# Patient Record
Sex: Male | Born: 1990 | Race: White | Hispanic: No | Marital: Married | Smoking: Never smoker
Health system: Southern US, Community
[De-identification: ages and names within clinical notes are randomized; demographics above are authoritative.]

## PROBLEM LIST (undated history)

## (undated) DIAGNOSIS — F988 Other specified behavioral and emotional disorders with onset usually occurring in childhood and adolescence: Secondary | ICD-10-CM

---

## 2009-08-22 ENCOUNTER — Emergency Department (HOSPITAL_BASED_OUTPATIENT_CLINIC_OR_DEPARTMENT_OTHER): Admission: EM | Admit: 2009-08-22 | Discharge: 2009-08-22 | Payer: Self-pay | Admitting: Emergency Medicine

## 2009-08-22 ENCOUNTER — Ambulatory Visit: Payer: Self-pay | Admitting: Diagnostic Radiology

## 2010-09-03 IMAGING — US US ART/VEN ABD/PELV/SCROTUM DOPPLER COMPLETE
1 series · 14 of 25 positions shown · non-contrast
Comparison: None.

CLINICAL DATA: Right testicular pain for 2 hours.

SCROTAL ULTRASOUND
DOPPLER ULTRASOUND OF THE TESTICLES
TECHNIQUE: Complete ultrasound examination of the testicles,
epididymis, and other scrotal structures was performed.  Color and
spectral Doppler ultrasound were also utilized to evaluate blood
flow to the testicles.

[Series 1: us art/ven abd/pelv/scrotum doppler complete · 0.08mm/px · 14 of 52 slices shown]
[im 1/52]
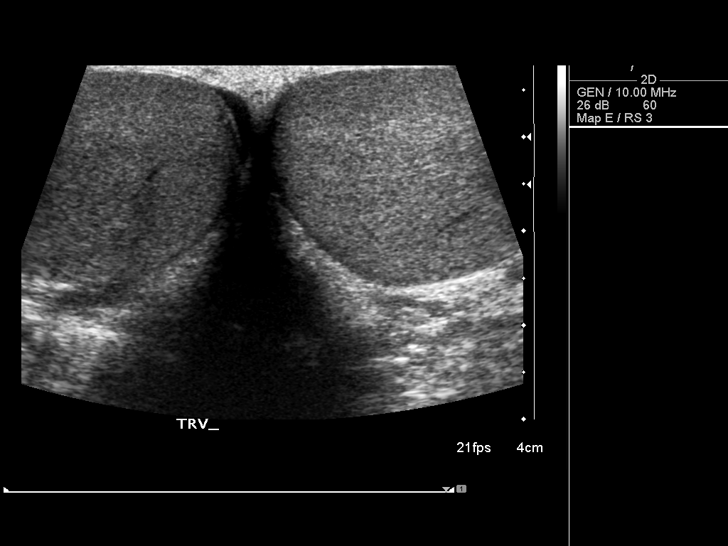
[im 5/52]
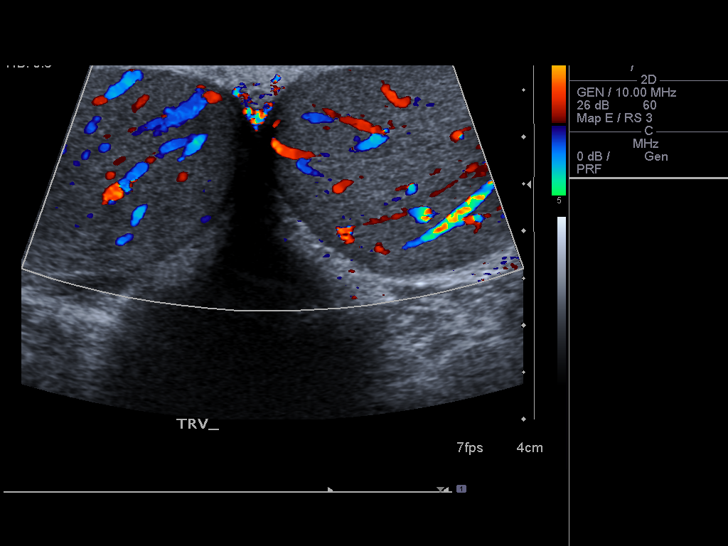
[im 9/52]
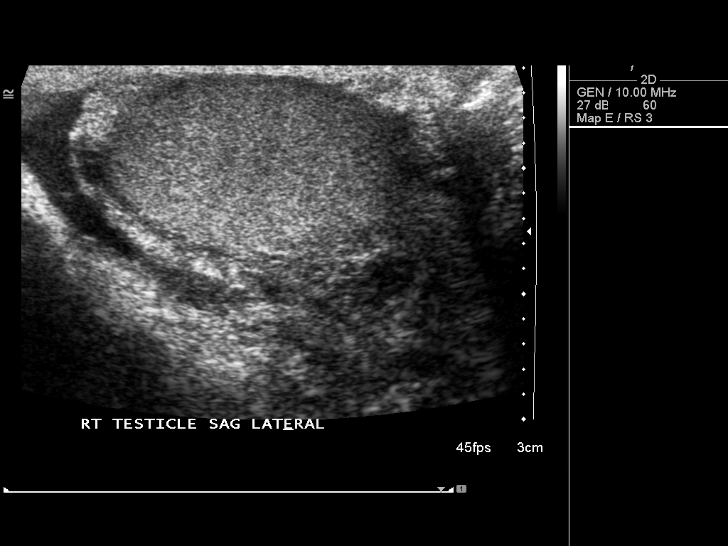
[im 13/52]
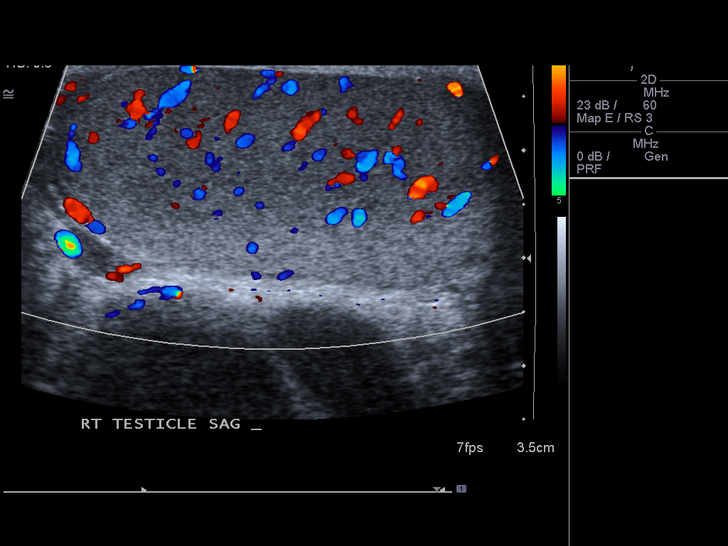
[im 18/52]
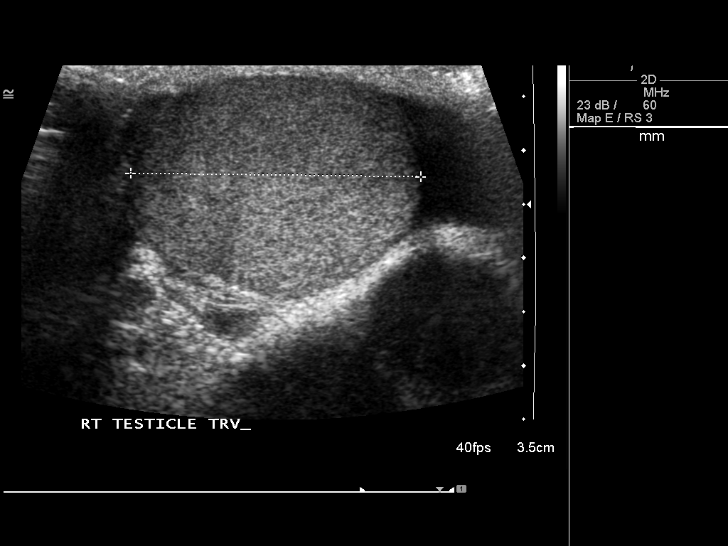
[im 20/52]
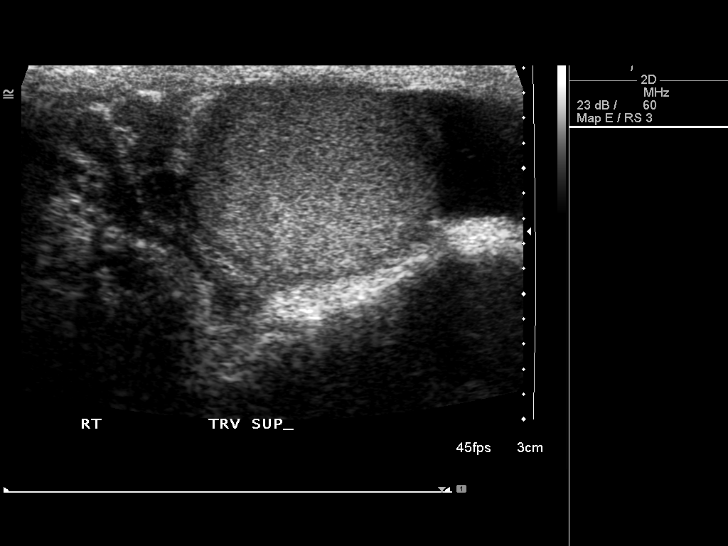
[im 24/52]
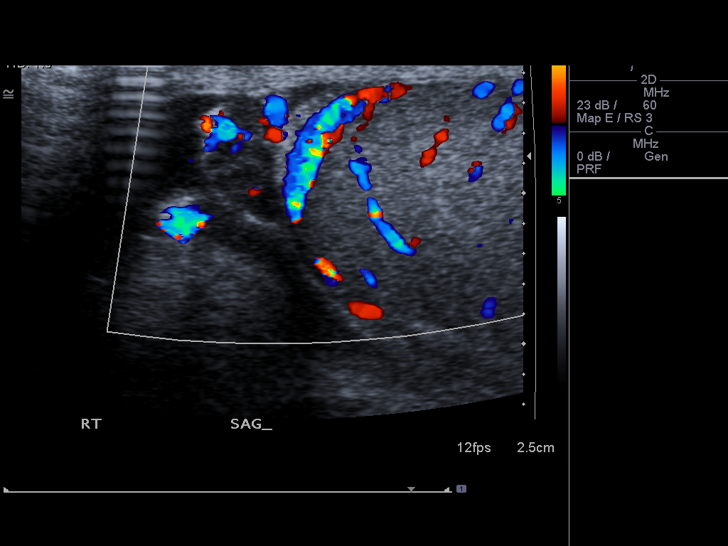
[im 28/52]
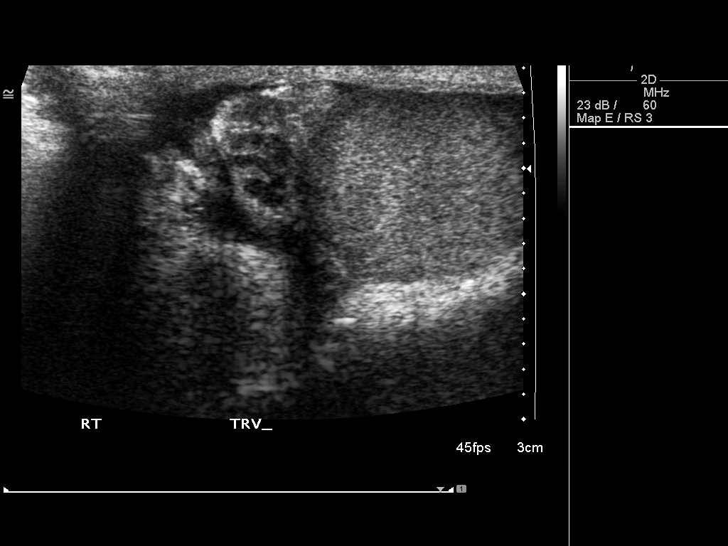
[im 32/52]
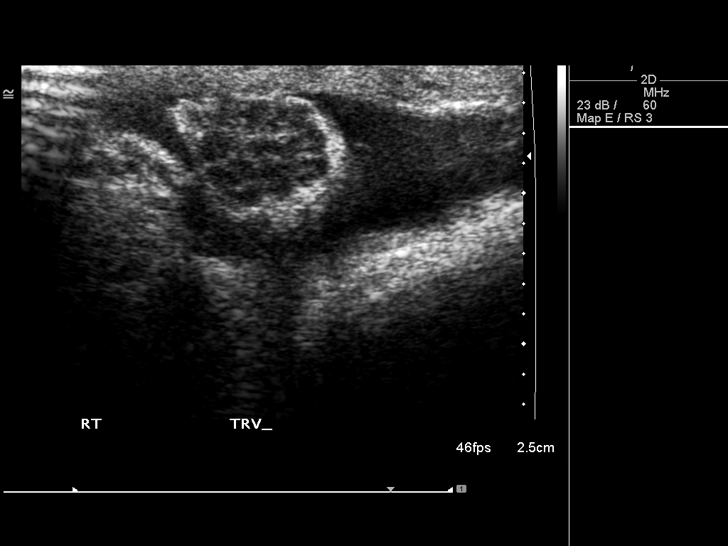
[im 35/52]
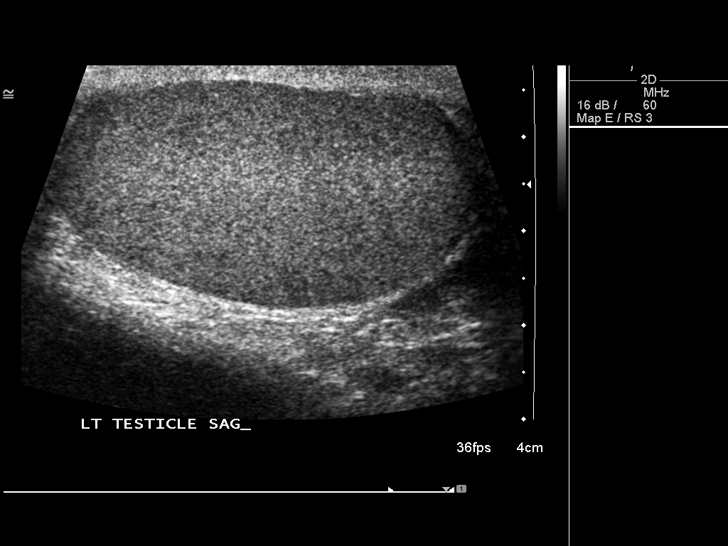
[im 39/52]
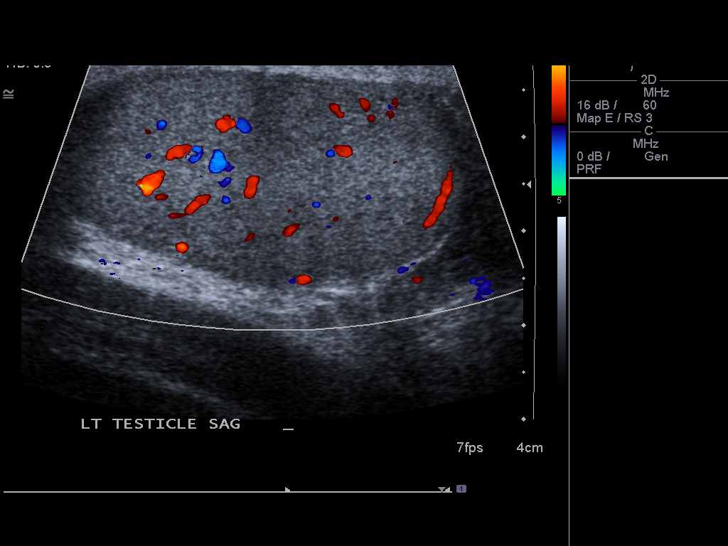
[im 43/52]
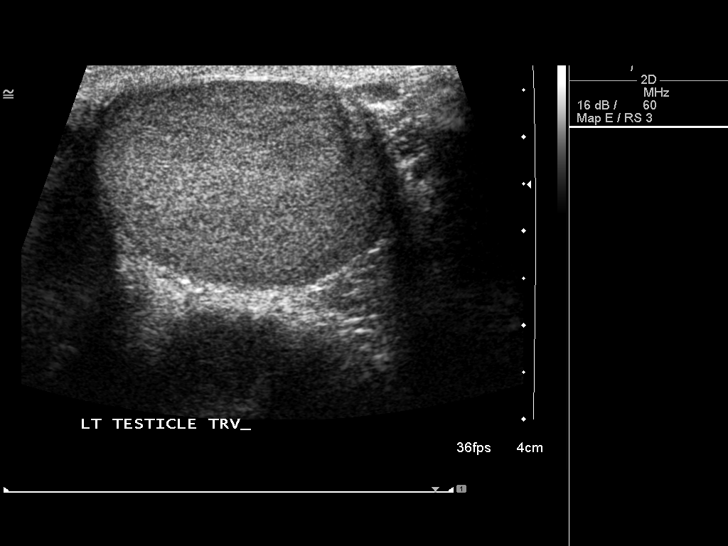
[im 47/52]
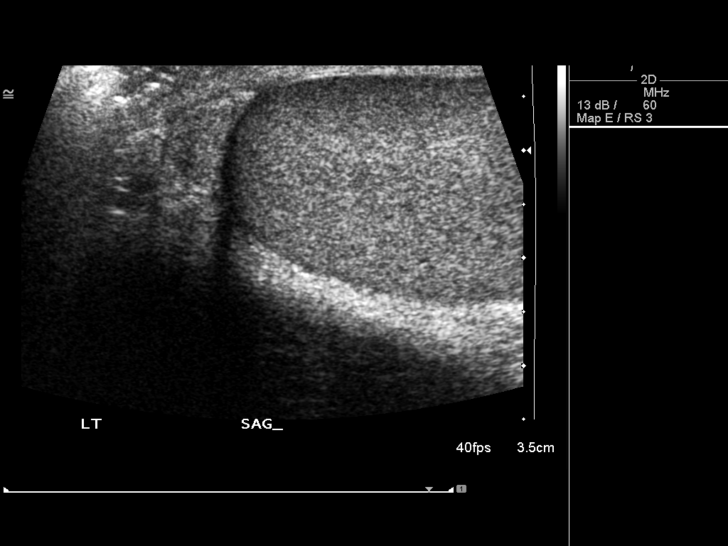
[im 52/52]
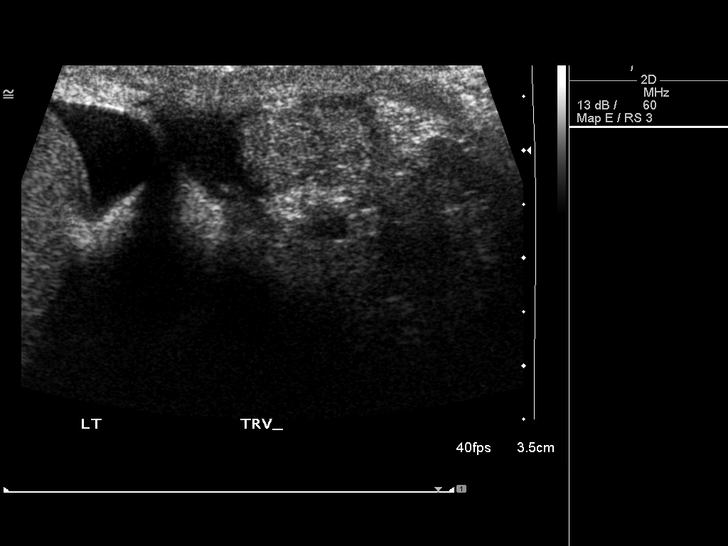

[14 of 25 positions shown; findings below may reference images not displayed]

FINDINGS: There is normal perfusion to both testicles.  The right
epididymis is inhomogeneous with multiple hypoechoic areas as well
as a moderate amount of fluid around the right testicle, consistent
with right epididymitis.

The right testicle is 4.5 x 2.1 x 2.7 cm and the left testicle is
4.4 x 2.5 x 3.1 cm.  There are normal arterial and venous waveforms
in both testicles.  No testicular masses.

The left epididymis is normal.  There are no varicoceles.
IMPRESSION: Right epididymitis.  Normal perfusion to both testicles.

## 2010-11-03 LAB — URINALYSIS, ROUTINE W REFLEX MICROSCOPIC
Bilirubin Urine: NEGATIVE
Glucose, UA: NEGATIVE mg/dL
Hgb urine dipstick: NEGATIVE
Nitrite: NEGATIVE
Specific Gravity, Urine: 1.028 (ref 1.005–1.030)
pH: 7.5 (ref 5.0–8.0)

## 2010-11-03 LAB — GC/CHLAMYDIA PROBE AMP, GENITAL: Chlamydia, DNA Probe: NEGATIVE

## 2012-10-28 ENCOUNTER — Emergency Department (HOSPITAL_BASED_OUTPATIENT_CLINIC_OR_DEPARTMENT_OTHER): Payer: Self-pay

## 2012-10-28 ENCOUNTER — Encounter (HOSPITAL_BASED_OUTPATIENT_CLINIC_OR_DEPARTMENT_OTHER): Payer: Self-pay | Admitting: *Deleted

## 2012-10-28 ENCOUNTER — Emergency Department (HOSPITAL_BASED_OUTPATIENT_CLINIC_OR_DEPARTMENT_OTHER)
Admission: EM | Admit: 2012-10-28 | Discharge: 2012-10-28 | Disposition: A | Discharge status: Home / Self Care | Payer: Self-pay | Attending: Emergency Medicine | Admitting: Emergency Medicine

## 2012-10-28 DIAGNOSIS — N453 Epididymo-orchitis: Secondary | ICD-10-CM | POA: Insufficient documentation

## 2012-10-28 DIAGNOSIS — R350 Frequency of micturition: Secondary | ICD-10-CM | POA: Insufficient documentation

## 2012-10-28 DIAGNOSIS — Z79899 Other long term (current) drug therapy: Secondary | ICD-10-CM | POA: Insufficient documentation

## 2012-10-28 DIAGNOSIS — F988 Other specified behavioral and emotional disorders with onset usually occurring in childhood and adolescence: Secondary | ICD-10-CM | POA: Insufficient documentation

## 2012-10-28 HISTORY — DX: Other specified behavioral and emotional disorders with onset usually occurring in childhood and adolescence: F98.8

## 2012-10-28 LAB — URINALYSIS, ROUTINE W REFLEX MICROSCOPIC
Ketones, ur: NEGATIVE mg/dL
Leukocytes, UA: NEGATIVE
Nitrite: NEGATIVE
Protein, ur: NEGATIVE mg/dL
pH: 7.5 (ref 5.0–8.0)

## 2012-10-28 MED ORDER — IBUPROFEN 800 MG PO TABS: 800.0000 mg | ORAL_TABLET | Freq: Three times a day (TID) | ORAL | Status: DC

## 2012-10-28 MED ORDER — HYDROCODONE-ACETAMINOPHEN 5-325 MG PO TABS: 2.0000 | ORAL_TABLET | ORAL | Status: DC | PRN

## 2012-10-28 MED ORDER — CEFTRIAXONE SODIUM 250 MG IJ SOLR
250.0000 mg | Freq: Once | INTRAMUSCULAR | Status: AC
  Administered 2012-10-28: 250 mg via INTRAMUSCULAR
  Filled 2012-10-28: qty 250

## 2012-10-28 MED ORDER — AZITHROMYCIN 250 MG PO TABS
1000.0000 mg | ORAL_TABLET | Freq: Once | ORAL | Status: AC
Start: 2012-10-28 — End: 2012-10-28
  Administered 2012-10-28: 1000 mg via ORAL
  Filled 2012-10-28: qty 4

## 2012-10-28 NOTE — ED Notes (Signed)
Testicle pain x 1 hour. Hx of same 3 years ago and was epididymitis.

## 2012-10-28 NOTE — ED Notes (Signed)
RN spoke with Pt. About eating before asking the EDP but Pt. Is eating at bedside anymore.

## 2012-10-28 NOTE — ED Notes (Signed)
No complaints of rash and no reaction to his injection.
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
## 2013-11-09 IMAGING — US US ART/VEN ABD/PELV/SCROTUM DOPPLER LTD
1 series · 13 of 25 positions shown · non-contrast
Comparison: 08/22/2009

CLINICAL DATA: Right testicular pain for 2 hours, history of
epididymitis

SCROTAL ULTRASOUND
DOPPLER ULTRASOUND OF THE TESTICLES
TECHNIQUE: Complete ultrasound examination of the testicles,
epididymis, and other scrotal structures was performed.  Color and
spectral Doppler ultrasound were also utilized to evaluate blood
flow to the testicles.

[Series 1: us art/ven abd/pelv/scrotum doppler ltd · 0.08mm/px · 13 of 48 slices shown]
[im 1/48]
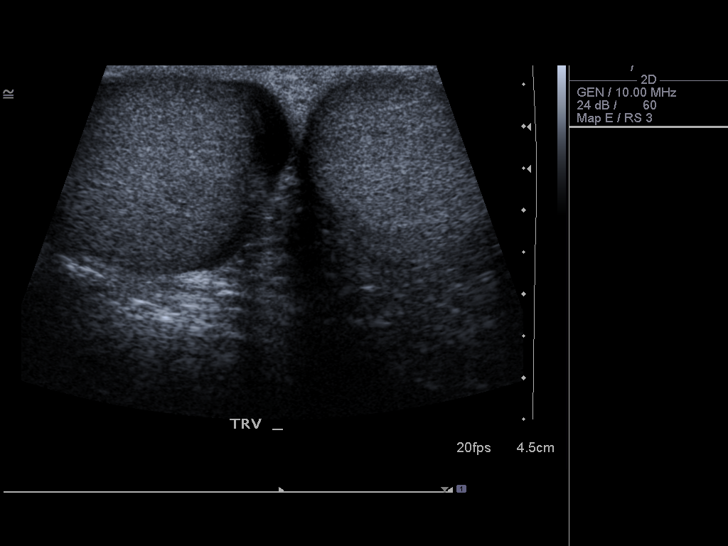
[im 4/48]
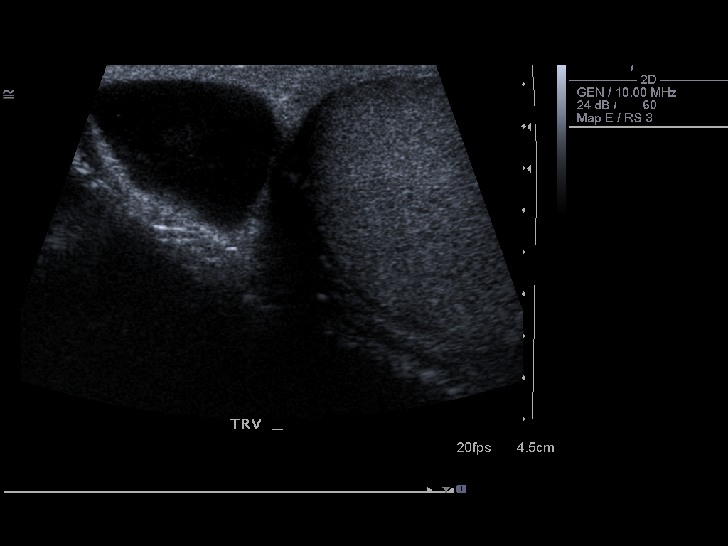
[im 8/48]
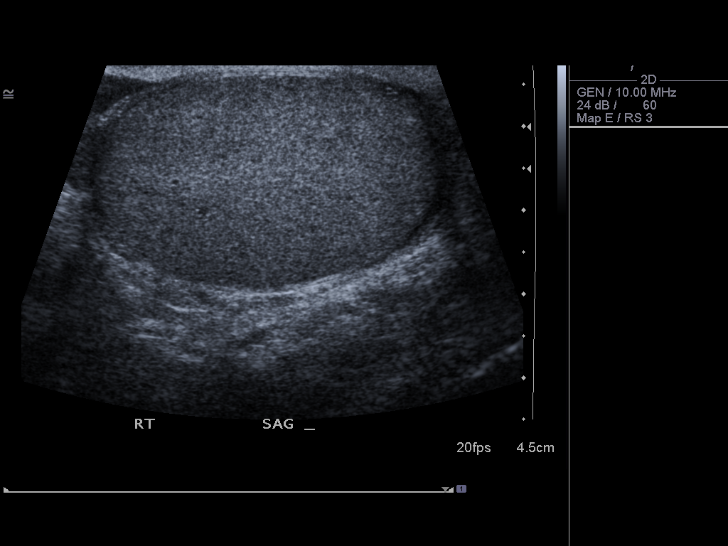
[im 12/48]
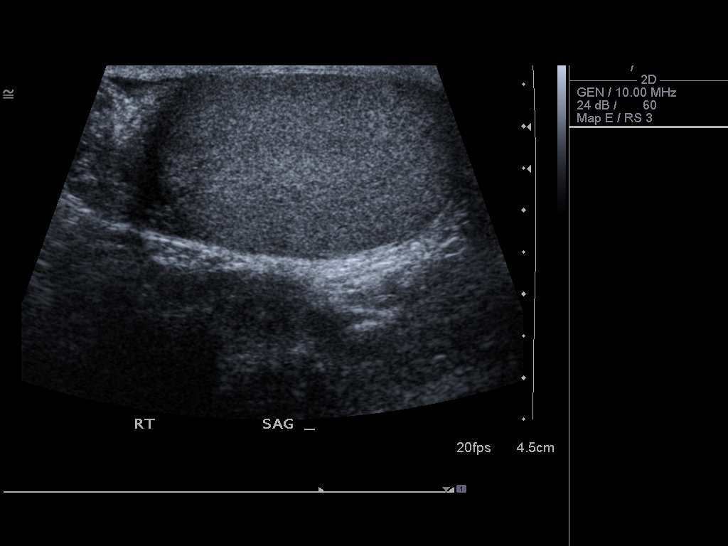
[im 16/48]
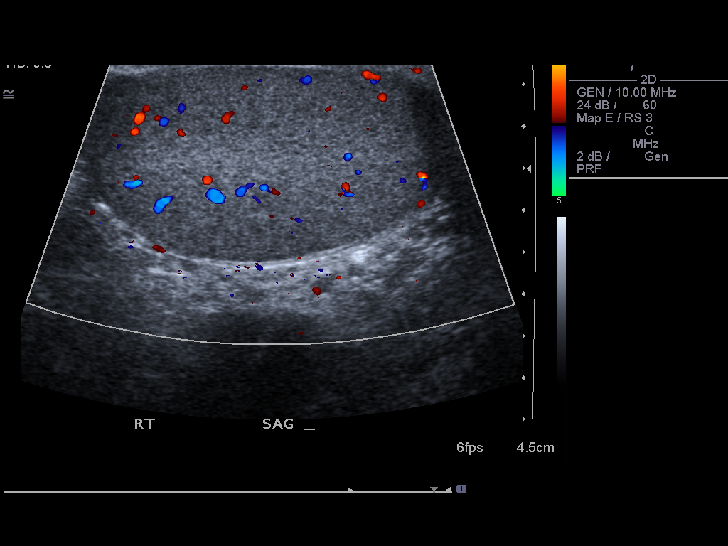
[im 20/48]
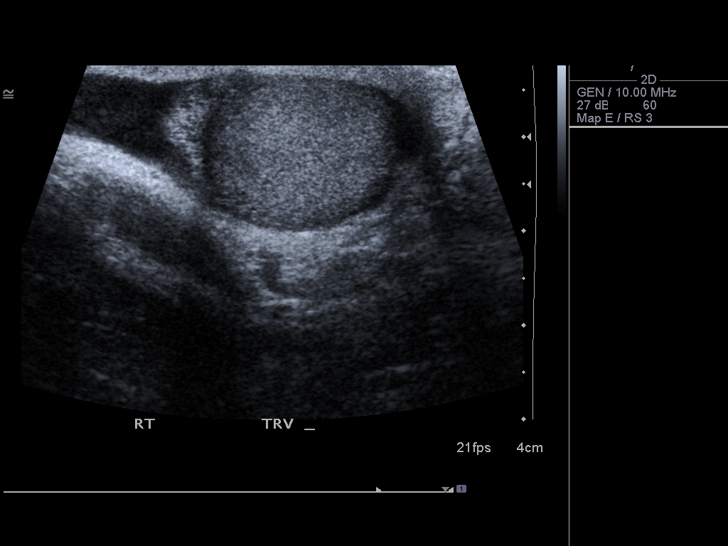
[im 24/48]
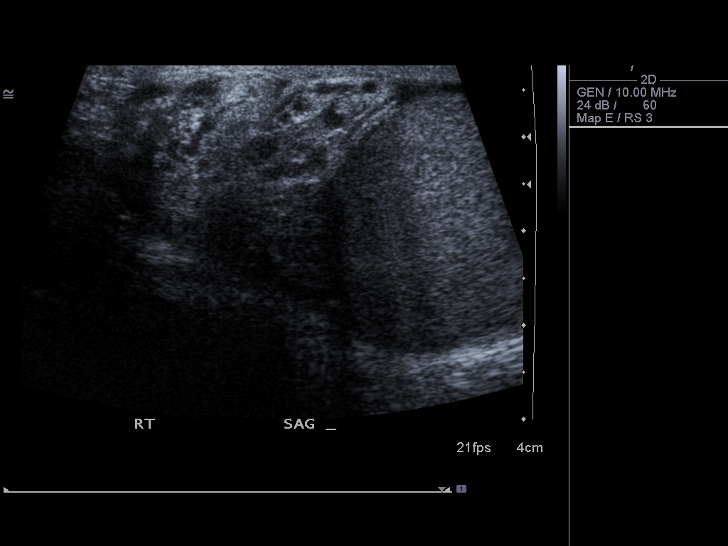
[im 28/48]
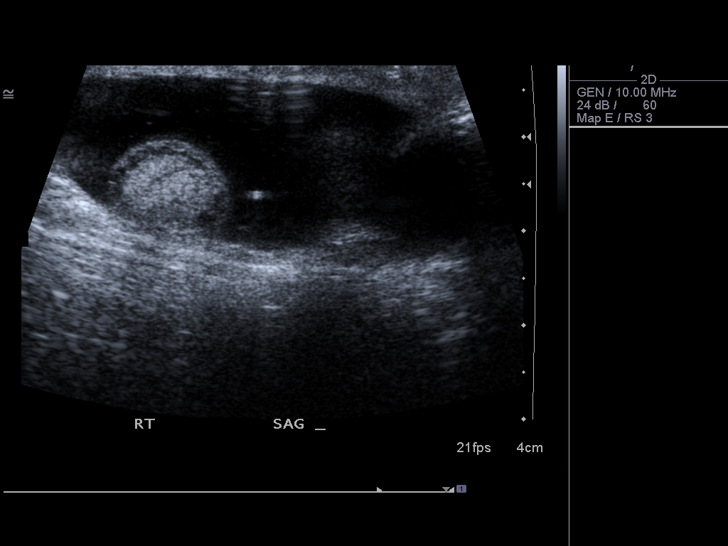
[im 32/48]
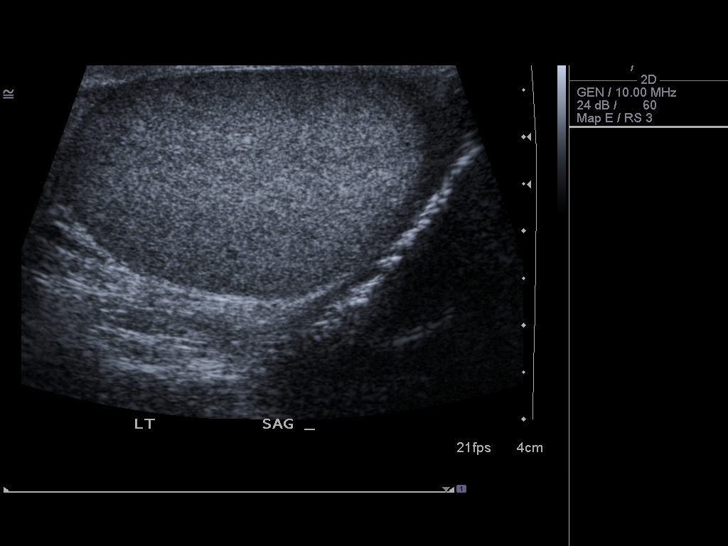
[im 36/48]
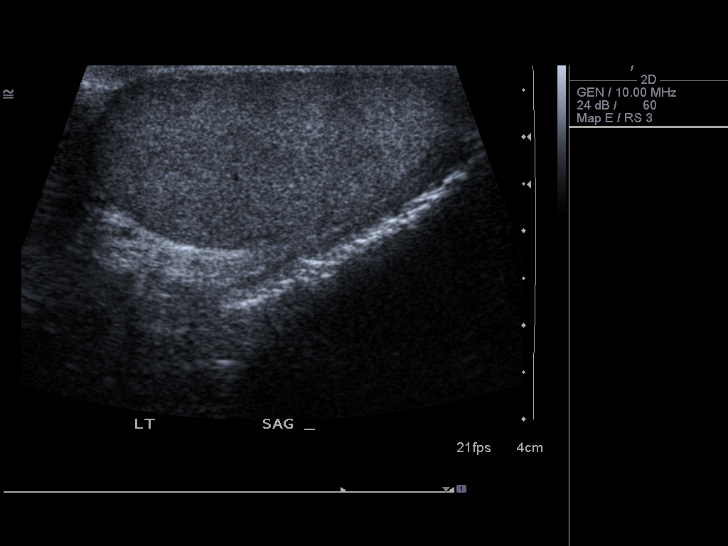
[im 40/48]
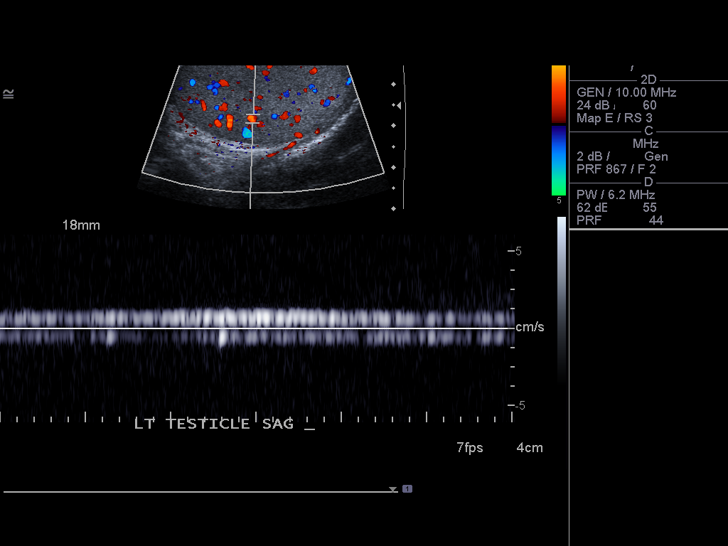
[im 44/48]
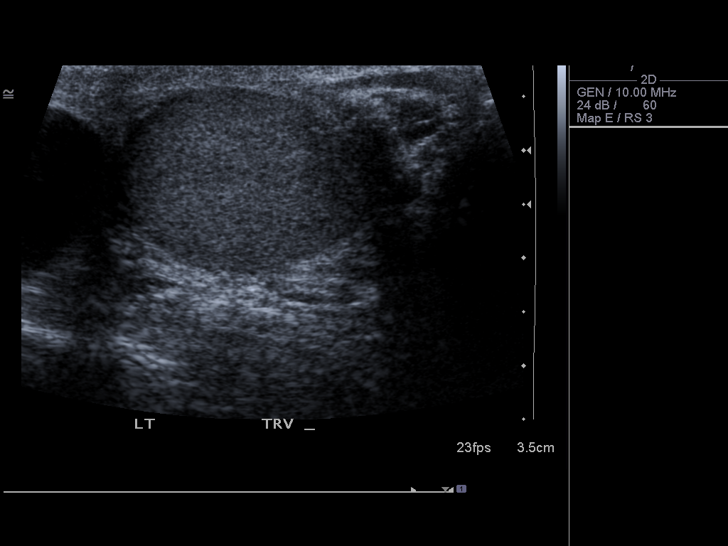
[im 48/48]
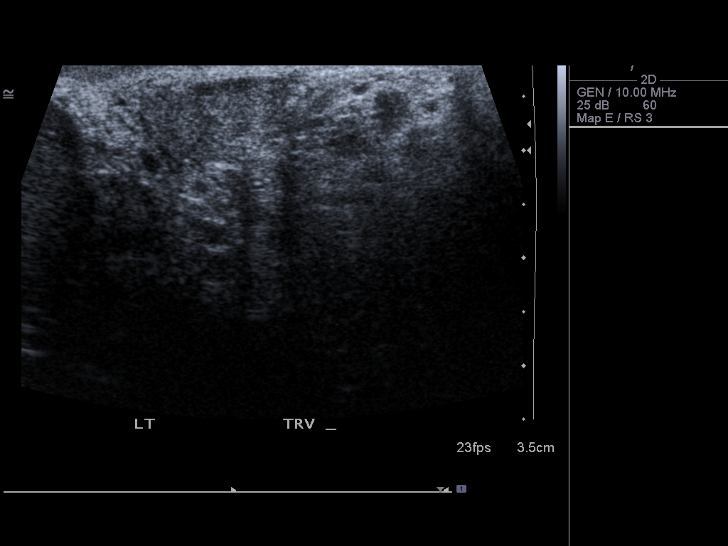

[13 of 25 positions shown; findings below may reference images not displayed]

FINDINGS: Right testis:  For followed by 2.6 x 2.8 cm.  Normal echogenicity
without mass or calcification.  Internal blood flow present on
color Doppler imaging.

Left testis:  3.0 x 2.4 x 2.8 cm.  Normal echogenicity without mass
or calcification.  Internal blood flow present on color Doppler
imaging.

Right epididymis:  Enlarged and heterogeneous in echogenicity.
Scattered tubular dilatation.  Appearance is similar to that seen
on the previous exam.  No discrete mass.  Mildly hypervascular on
color Doppler imaging versus left.

Left epididymis:  Normal in size and appearance.

Hydrocele:  Small right hydrocele.  No left hydrocele.

Varicocele:  No varicoceles identified.

Pulsed Doppler interrogation of both testes demonstrates
intratesticular venous and low resistance arterial waveforms
bilaterally.

No evidence of hernia.
IMPRESSION: Enlarged and heterogeneous right epididymis with mild
hypervascularity versus left, appearance similar to previous exam,
most consistent with right epididymitis.
Small likely reactive right hydrocele.
No evidence of testicular mass or torsion.

## 2014-01-11 ENCOUNTER — Telehealth: Payer: Self-pay
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
## 2014-01-12 NOTE — Telephone Encounter (Signed)
Lm for rtn call 
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
## 2015-07-05 ENCOUNTER — Encounter: Payer: Self-pay | Admitting: *Deleted

## 2015-07-05 ENCOUNTER — Emergency Department
Admission: EM | Admit: 2015-07-05 | Discharge: 2015-07-05 | Disposition: A | Discharge status: Home / Self Care | Payer: Self-pay | Source: Home / Self Care | Attending: Family Medicine | Admitting: Family Medicine

## 2015-07-05 DIAGNOSIS — L03114 Cellulitis of left upper limb: Secondary | ICD-10-CM

## 2015-07-05 MED ORDER — DOXYCYCLINE HYCLATE 100 MG PO CAPS: 100.0000 mg | ORAL_CAPSULE | Freq: Two times a day (BID) | ORAL | Status: AC

## 2015-07-05 MED ORDER — MUPIROCIN 2 % EX OINT: 1.0000 "application " | TOPICAL_OINTMENT | Freq: Three times a day (TID) | CUTANEOUS | Status: AC

## 2015-07-05 NOTE — Discharge Instructions (Signed)
Change dressing daily and apply Bactroban (mupirocin) ointment to wound until healed.  Keep wound clean and dry.  Return for any signs of infection:  Increasing redness, swelling, pain, heat, drainage, etc.   Cellulitis Cellulitis is an infection of the skin and the tissue beneath it. The infected area is usually red and tender. Cellulitis occurs most often in the arms and lower legs.  CAUSES  Cellulitis is caused by bacteria that enter the skin through cracks or cuts in the skin. The most common types of bacteria that cause cellulitis are staphylococci and streptococci. SIGNS AND SYMPTOMS   Redness and warmth.  Swelling.  Tenderness or pain.  Fever. DIAGNOSIS  Your health care provider can usually determine what is wrong based on a physical exam. Blood tests may also be done. TREATMENT  Treatment usually involves taking an antibiotic medicine. HOME CARE INSTRUCTIONS   Take your antibiotic medicine as directed by your health care provider. Finish the antibiotic even if you start to feel better.  Keep the infected arm or leg elevated to reduce swelling.  Apply a warm cloth to the affected area up to 4 times per day to relieve pain.  Take medicines only as directed by your health care provider.  Keep all follow-up visits as directed by your health care provider. SEEK MEDICAL CARE IF:   You notice red streaks coming from the infected area.  Your red area gets larger or turns dark in color.  Your bone or joint underneath the infected area becomes painful after the skin has healed.  Your infection returns in the same area or another area.  You notice a swollen bump in the infected area.  You develop new symptoms.  You have a fever. SEEK IMMEDIATE MEDICAL CARE IF:   You feel very sleepy.  You develop vomiting or diarrhea.  You have a general ill feeling (malaise) with muscle aches and pains.   This information is not intended to replace advice given to you by your  health care provider. Make sure you discuss any questions you have with your health care provider.   Document Released: 05/14/2005 Document Revised: 04/25/2015 Document Reviewed: 10/20/2011 Elsevier Interactive Patient Education [employer]2016 [employer] Inc.
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
## 2015-07-05 NOTE — ED Provider Notes (Signed)
CSN: [id number]     Arrival date & time 07/05/15  1619 History   First MD Initiated Contact with Patient 07/05/15 1643     Chief Complaint  Patient presents with  . Extremity Laceration    HPI Comments: Patient scraped his left forearm on a pallet two days ago.  He has developed increasing redness/soreness around the wound.  No fevers, chills, and sweats.  His last Tdap was 01/09/14.  The history is provided by the patient.    Past Medical History  Diagnosis Date  . ADD (attention deficit disorder)    History reviewed. No pertinent past surgical history. History reviewed. No pertinent family history. Social History  Substance Use Topics  . Smoking status: Never Smoker   . Smokeless tobacco: Current User  . Alcohol Use: No    Review of Systems  Constitutional: Negative for fever, chills and fatigue.  HENT: Negative.   Eyes: Negative.   Respiratory: Negative.   Cardiovascular: Negative.   Endocrine: Negative.   Genitourinary: Negative.   Musculoskeletal: Negative for myalgias, joint swelling and arthralgias.  Skin: Positive for color change and wound.  All other systems reviewed and are negative.   Allergies  Review of patient's allergies indicates no known allergies.  Home Medications   Prior to Admission medications   Medication Sig Start Date End Date Taking? Authorizing Provider  Amphetamine-Dextroamphetamine (ADDERALL PO) Take by mouth.    Historical Provider, MD  doxycycline (VIBRAMYCIN) 100 MG capsule Take 1 capsule (100 mg total) by mouth 2 (two) times daily. Take with food. 07/05/15   Lattie HawStephen A Beese, MD  mupirocin ointment (BACTROBAN) 2 % Apply 1 application topically 3 (three) times daily. 07/05/15   Lattie HawStephen A Beese, MD   Meds Ordered and Administered this Visit  Medications - No data to display  BP 135/83 mmHg  Pulse 80  Temp(Src) 97.8 F (36.6 C) (Oral)  Resp 16  Ht 5\' 11"  (1.803 m)  Wt 207 lb (93.895 kg)  BMI 28.88 kg/m2  SpO2 98% No data  found.   Physical Exam  Constitutional: He is oriented to person, place, and time. He appears well-developed and well-nourished. No distress.  HENT:  Head: Normocephalic.  Eyes: Conjunctivae are normal. Pupils are equal, round, and reactive to light.  Pulmonary/Chest: No respiratory distress.  Musculoskeletal:       Left forearm: He exhibits tenderness. He exhibits no bony tenderness, no swelling, no edema and no deformity.       Arms: Left forearm has a superficial abrasion present as noted on diagram. There is mild erythema advancing from the wound but no definite lymphangitis and no drainage.     Minimal tenderness to palpation.  Distal neurovascular function is intact.   Neurological: He is alert and oriented to person, place, and time.  Skin: Skin is warm and dry. He is not diaphoretic.  Nursing note and vitals reviewed.   ED Course  Procedures none   MDM   1. Cellulitis of left forearm    Bacitracin and bandage applied to wound. Wound culture pending.  Begin doxycycline for staph coverage.  Begin Bactroban ointment. Change dressing daily and apply Bactroban (mupirocin) ointment to wound until healed.  Keep wound clean and dry.  Return for any signs of infection:  Increasing redness, swelling, pain, heat, drainage, etc.    Lattie HawStephen A Beese, MD 07/05/15 [phone]

## 2015-07-08 ENCOUNTER — Telehealth: Payer: Self-pay | Admitting: Emergency Medicine

## 2015-07-08 LAB — WOUND CULTURE
GRAM STAIN: NONE SEEN
Gram Stain: NONE SEEN
Organism ID, Bacteria: NO GROWTH
